# Patient Record
Sex: Male | Born: 1945 | Hispanic: Yes | Smoking: Former smoker
Health system: Southern US, Community
[De-identification: ages and names within clinical notes are randomized; demographics above are authoritative.]

## PROBLEM LIST (undated history)

## (undated) DIAGNOSIS — I10 Essential (primary) hypertension: Secondary | ICD-10-CM

---

## 2014-05-05 ENCOUNTER — Emergency Department (HOSPITAL_BASED_OUTPATIENT_CLINIC_OR_DEPARTMENT_OTHER)
Admission: EM | Admit: 2014-05-05 | Discharge: 2014-05-06 | Disposition: A | Discharge status: Home / Self Care | Payer: Medicaid Other | Attending: Emergency Medicine | Admitting: Emergency Medicine

## 2014-05-05 ENCOUNTER — Emergency Department (HOSPITAL_BASED_OUTPATIENT_CLINIC_OR_DEPARTMENT_OTHER): Payer: Medicaid Other

## 2014-05-05 ENCOUNTER — Encounter (HOSPITAL_BASED_OUTPATIENT_CLINIC_OR_DEPARTMENT_OTHER): Payer: Self-pay

## 2014-05-05 DIAGNOSIS — R0789 Other chest pain: Secondary | ICD-10-CM | POA: Diagnosis not present

## 2014-05-05 DIAGNOSIS — R079 Chest pain, unspecified: Secondary | ICD-10-CM

## 2014-05-05 DIAGNOSIS — M791 Myalgia: Secondary | ICD-10-CM | POA: Insufficient documentation

## 2014-05-05 DIAGNOSIS — Z87891 Personal history of nicotine dependence: Secondary | ICD-10-CM | POA: Diagnosis not present

## 2014-05-05 DIAGNOSIS — R509 Fever, unspecified: Secondary | ICD-10-CM | POA: Diagnosis not present

## 2014-05-05 DIAGNOSIS — R1031 Right lower quadrant pain: Secondary | ICD-10-CM

## 2014-05-05 DIAGNOSIS — R0602 Shortness of breath: Secondary | ICD-10-CM | POA: Diagnosis not present

## 2014-05-05 LAB — COMPREHENSIVE METABOLIC PANEL
ALBUMIN: 4.3 g/dL (ref 3.5–5.2)
ALT: 29 U/L (ref 0–53)
ANION GAP: 10 (ref 5–15)
AST: 22 U/L (ref 0–37)
Alkaline Phosphatase: 150 U/L — ABNORMAL HIGH (ref 39–117)
BUN: 14 mg/dL (ref 6–23)
CO2: 28 mmol/L (ref 19–32)
CREATININE: 1.03 mg/dL (ref 0.50–1.35)
Calcium: 9.3 mg/dL (ref 8.4–10.5)
Chloride: 98 mEq/L (ref 96–112)
GFR calc Af Amer: 84 mL/min — ABNORMAL LOW (ref >90)
GFR calc non Af Amer: 73 mL/min — ABNORMAL LOW (ref >90)
Glucose, Bld: 172 mg/dL — ABNORMAL HIGH (ref 70–99)
Potassium: 4 mmol/L (ref 3.5–5.1)
Sodium: 136 mmol/L (ref 135–145)
Total Bilirubin: 0.2 mg/dL — ABNORMAL LOW (ref 0.3–1.2)
Total Protein: 7.6 g/dL (ref 6.0–8.3)

## 2014-05-05 LAB — TROPONIN I

## 2014-05-05 LAB — CBC
HCT: 43.5 % (ref 39.0–52.0)
Hemoglobin: 14.8 g/dL (ref 13.0–17.0)
MCH: 28.5 pg (ref 26.0–34.0)
MCHC: 34 g/dL (ref 30.0–36.0)
MCV: 83.8 fL (ref 78.0–100.0)
Platelets: 215 10*3/uL (ref 150–400)
RBC: 5.19 MIL/uL (ref 4.22–5.81)
RDW: 13.4 % (ref 11.5–15.5)
WBC: 11.3 10*3/uL — ABNORMAL HIGH (ref 4.0–10.5)

## 2014-05-05 LAB — URINE MICROSCOPIC-ADD ON

## 2014-05-05 LAB — URINALYSIS, ROUTINE W REFLEX MICROSCOPIC
Bilirubin Urine: NEGATIVE
Glucose, UA: NEGATIVE mg/dL
Ketones, ur: NEGATIVE mg/dL
LEUKOCYTES UA: NEGATIVE
Nitrite: NEGATIVE
Protein, ur: NEGATIVE mg/dL
SPECIFIC GRAVITY, URINE: 1.018 (ref 1.005–1.030)
UROBILINOGEN UA: 0.2 mg/dL (ref 0.0–1.0)
pH: 6 (ref 5.0–8.0)

## 2014-05-05 LAB — D-DIMER, QUANTITATIVE: D-Dimer, Quant: 0.36 ug/mL-FEU (ref 0.00–0.48)

## 2014-05-05 MED ORDER — IOHEXOL 300 MG/ML  SOLN
100.0000 mL | Freq: Once | INTRAMUSCULAR | Status: AC | PRN
  Administered 2014-05-05: 100 mL via INTRAVENOUS

## 2014-05-05 MED ORDER — IOHEXOL 300 MG/ML  SOLN
25.0000 mL | Freq: Once | INTRAMUSCULAR | Status: AC | PRN
  Administered 2014-05-05: 25 mL via ORAL

## 2014-05-05 MED ORDER — ACETAMINOPHEN 325 MG PO TABS
ORAL_TABLET | ORAL | Status: AC
  Filled 2014-05-05: qty 2

## 2014-05-05 MED ORDER — ACETAMINOPHEN 325 MG PO TABS
650.0000 mg | ORAL_TABLET | Freq: Once | ORAL | Status: AC
  Administered 2014-05-05: 650 mg via ORAL

## 2014-05-05 NOTE — ED Notes (Signed)
C/o PC sine since 7pm-daughter interpreting

## 2014-05-05 NOTE — ED Provider Notes (Signed)
CSN: [id number]     Arrival date & time 05/05/14  1925 History  This chart was scribed for Brent Shiobert L Lynasia Meloche, MD by Brent Fisher, ED Scribe. This patient was seen in room MH03/MH03 and the patient's care was started at 7:58 PM.    Chief Complaint  Patient presents with  . Chest Pain     Patient is a 68 y.o. male presenting with chest pain. The history is provided by a relative. No language interpreter was used.  Chest Pain Associated symptoms: fever and shortness of breath   Associated symptoms: no cough     HPI Comments: Brent Fisher is a 68 y.o. male who presents to the Emergency Department complaining of chest pain. Pt's daughter is here to translate. She notes the pain radiates to his arms bilaterally. She notes associated SOB. She states they were bowling and he was fine before onset. She states she was unaware that he was febrile PTA. She notes his blood pressure has been elevated in the last week and he has not felt well because of this. He notes he has felt cold in the last couple of days.  he denies a cough.  He denies being exposed to anyone recently with a febrile illness.  He did travel to [place] approximately 2 weeks ago.  History reviewed. No pertinent past medical history. History reviewed. No pertinent past surgical history. No family history on file. History  Substance Use Topics  . Smoking status: Former Games developermoker  . Smokeless tobacco: Not on file  . Alcohol Use: Yes    Review of Systems  Constitutional: Positive for fever.  Respiratory: Positive for shortness of breath. Negative for cough.   Cardiovascular: Positive for chest pain.  Musculoskeletal: Positive for myalgias.  All other systems reviewed and are negative.     Allergies  Review of patient's allergies indicates no known allergies.  Home Medications   Prior to Admission medications   Not on File   BP 138/64 mmHg  Pulse 94  Temp(Src) 99.7 F (37.6 C) (Oral)  Resp 24  SpO2  95% Physical Exam  Constitutional: He is oriented to person, place, and time. He appears well-developed and well-nourished. No distress.  HENT:  Head: Normocephalic and atraumatic.  Eyes: Pupils are equal, round, and reactive to light.  Neck: Normal range of motion.  Cardiovascular: Normal rate and intact distal pulses.   Pulmonary/Chest: No respiratory distress.  Abdominal: Normal appearance. He exhibits no distension. There is tenderness in the right lower quadrant. There is tenderness at McBurney's point. There is no rigidity, no rebound and no guarding.    Musculoskeletal: Normal range of motion.  Neurological: He is alert and oriented to person, place, and time. No cranial nerve deficit.  Skin: Skin is warm and dry. No rash noted.  Psychiatric: He has a normal mood and affect. His behavior is normal.  Nursing note and vitals reviewed.   ED Course  Procedures (including critical care time) DIAGNOSTIC STUDIES: Oxygen Saturation is 94% on room air, adequate by my interpretation.    COORDINATION OF CARE: 8:02 PM Discussed treatment plan with patient at beside, the patient agrees with the plan and has no further questions at this time.   Labs Review Labs Reviewed  CBC - Abnormal; Notable for the following:    WBC 11.3 (*)    All other components within normal limits  COMPREHENSIVE METABOLIC PANEL - Abnormal; Notable for the following:    Glucose, Bld 172 (*)  Alkaline Phosphatase 150 (*)    Total Bilirubin 0.2 (*)    GFR calc non Af Amer 73 (*)    GFR calc Af Amer 84 (*)    All other components within normal limits  URINALYSIS, ROUTINE W REFLEX MICROSCOPIC - Abnormal; Notable for the following:    Hgb urine dipstick SMALL (*)    All other components within normal limits  TROPONIN I  D-DIMER, QUANTITATIVE  TROPONIN I  URINE MICROSCOPIC-ADD ON    Imaging Review Results for orders placed or performed during the hospital encounter of 05/05/14  CBC  Result Value Ref  Range   WBC 11.3 (H) 4.0 - 10.5 K/uL   RBC 5.19 4.22 - 5.81 MIL/uL   Hemoglobin 14.8 13.0 - 17.0 g/dL   HCT 16.1 09.6 - 04.5 %   MCV 83.8 78.0 - 100.0 fL   MCH 28.5 26.0 - 34.0 pg   MCHC 34.0 30.0 - 36.0 g/dL   RDW 40.9 81.1 - 91.4 %   Platelets 215 150 - 400 K/uL  Comprehensive metabolic panel  Result Value Ref Range   Sodium 136 135 - 145 mmol/L   Potassium 4.0 3.5 - 5.1 mmol/L   Chloride 98 96 - 112 mEq/L   CO2 28 19 - 32 mmol/L   Glucose, Bld 172 (H) 70 - 99 mg/dL   BUN 14 6 - 23 mg/dL   Creatinine, Ser 7.82 0.50 - 1.35 mg/dL   Calcium 9.3 8.4 - 95.6 mg/dL   Total Protein 7.6 6.0 - 8.3 g/dL   Albumin 4.3 3.5 - 5.2 g/dL   AST 22 0 - 37 U/L   ALT 29 0 - 53 U/L   Alkaline Phosphatase 150 (H) 39 - 117 U/L   Total Bilirubin 0.2 (L) 0.3 - 1.2 mg/dL   GFR calc non Af Amer 73 (L) >90 mL/min   GFR calc Af Amer 84 (L) >90 mL/min   Anion gap 10 5 - 15  Troponin I  Result Value Ref Range   Troponin I <0.03 <0.031 ng/mL  Urinalysis, Routine w reflex microscopic  Result Value Ref Range   Color, Urine YELLOW YELLOW   APPearance CLEAR CLEAR   Specific Gravity, Urine 1.018 1.005 - 1.030   pH 6.0 5.0 - 8.0   Glucose, UA NEGATIVE NEGATIVE mg/dL   Hgb urine dipstick SMALL (A) NEGATIVE   Bilirubin Urine NEGATIVE NEGATIVE   Ketones, ur NEGATIVE NEGATIVE mg/dL   Protein, ur NEGATIVE NEGATIVE mg/dL   Urobilinogen, UA 0.2 0.0 - 1.0 mg/dL   Nitrite NEGATIVE NEGATIVE   Leukocytes, UA NEGATIVE NEGATIVE  D-dimer, quantitative  Result Value Ref Range   D-Dimer, Quant 0.36 0.00 - 0.48 ug/mL-FEU  Troponin I  Result Value Ref Range   Troponin I <0.03 <0.031 ng/mL  Urine microscopic-add on  Result Value Ref Range   Squamous Epithelial / LPF RARE RARE   WBC, UA 0-2 <3 WBC/hpf   RBC / HPF 0-2 <3 RBC/hpf   Dg Chest 2 View  05/05/2014   CLINICAL DATA:  Sudden onset chest pain, difficulty breathing, fever  EXAM: CHEST  2 VIEW  COMPARISON:  None.  FINDINGS: Lungs are essentially clear.  No  pleural effusion or pneumothorax.  Heart is normal in size.  Mild degenerative changes of the visualized thoracolumbar spine.  IMPRESSION: No evidence of acute cardiopulmonary disease.   Electronically Signed   By: Charline Bills M.D.   On: 05/05/2014 20:22   Ct Abdomen Pelvis W Contrast  05/05/2014  CLINICAL DATA:  Chest pain beginning last night, radiating to bilateral arms. RIGHT lower quadrant tenderness.  EXAM: CT ABDOMEN AND PELVIS WITH CONTRAST  TECHNIQUE: Multidetector CT imaging of the abdomen and pelvis was performed using the standard protocol following bolus administration of intravenous contrast.  CONTRAST:  100 cc Omnipaque 300.  COMPARISON:  None.  FINDINGS: LUNG BASES: Included view of the lung bases demonstrate dependent atelectasis. Heart size is upper limits of normal, pericardium is unremarkable.  SOLID ORGANS: The spleen, gallbladder, pancreas and adrenal glands are unremarkable. The liver is diffusely hypodense consistent with hepatic steatosis and, otherwise unremarkable.  GASTROINTESTINAL TRACT: The stomach, small and large bowel are normal in course and caliber without inflammatory changes. Enteric contrast has not yet reached the distal small bowel. The appendix is not discretely identified, however there are no inflammatory changes in the right lower quadrant.  KIDNEYS/ URINARY TRACT: Kidneys are orthotopic, demonstrating symmetric enhancement. No nephrolithiasis, hydronephrosis or solid renal masses. Too small to characterize hypodensities appearing kidneys bilaterally, superimposed cysts measure up to 16 mm RIGHT upper pole. 2 mm nonobstructing LEFT lower pole nephrolithiasis. The unopacified ureters are normal in course and caliber. Delayed imaging through the kidneys demonstrates symmetric prompt contrast excretion within the proximal urinary collecting system. Urinary bladder is partially distended and unremarkable.  PERITONEUM/RETROPERITONEUM: No intraperitoneal free fluid  nor free air. Aortoiliac vessels are normal in course and caliber. No lymphadenopathy by CT size criteria. Prostate is enlarged, 5.5 x 4.5 cm.  SOFT TISSUE/OSSEOUS STRUCTURES: Nonsuspicious. Severe L5-S1 degenerative disc resulting in severe neural foraminal narrowing at this level.  IMPRESSION: Nonvisualized appendix without secondary signs of acute appendicitis. No acute intra-abdominal or pelvic process.  Nonobstructing 2 mm LEFT lower pole nephrolithiasis.  Hepatic steatosis.   Electronically Signed   By: Awilda Metroourtnay  Bloomer   On: 05/05/2014 22:58       EKG Interpretation   Date/Time:  Monday May 05 2014 19:37:24 EST Ventricular Rate:  107 PR Interval:  152 QRS Duration: 90 QT Interval:  326 QTC Calculation: 435 R Axis:   -34 Text Interpretation:  Sinus tachycardia Left axis deviation Abnormal ECG  No previous tracing Confirmed by Rayce Brahmbhatt  MD, Soley Harriss ([id number]) on 05/05/2014  7:57:00 PM      MDM   Final diagnoses:  RLQ abdominal pain  Atypical chest pain  Acute febrile illness  I personally performed the services described in this documentation, which was scribed in my presence. The recorded information has been reviewed and considered.    Brent Shiobert L Mylah Baynes, MD 05/15/14 [phone]

## 2014-05-05 NOTE — ED Provider Notes (Signed)
Nursing notes and vitals signs, including pulse oximetry, reviewed.  Summary of this visit's results, reviewed by myself:  Labs:  Results for orders placed or performed during the hospital encounter of 05/05/14 (from the past 24 hour(s))  CBC     Status: Abnormal   Collection Time: 05/05/14  7:35 PM  Result Value Ref Range   WBC 11.3 (H) 4.0 - 10.5 K/uL   RBC 5.19 4.22 - 5.81 MIL/uL   Hemoglobin 14.8 13.0 - 17.0 g/dL   HCT 09.843.5 11.939.0 - 14.752.0 %   MCV 83.8 78.0 - 100.0 fL   MCH 28.5 26.0 - 34.0 pg   MCHC 34.0 30.0 - 36.0 g/dL   RDW 82.913.4 56.211.5 - 13.015.5 %   Platelets 215 150 - 400 K/uL  Comprehensive metabolic panel     Status: Abnormal   Collection Time: 05/05/14  7:35 PM  Result Value Ref Range   Sodium 136 135 - 145 mmol/L   Potassium 4.0 3.5 - 5.1 mmol/L   Chloride 98 96 - 112 mEq/L   CO2 28 19 - 32 mmol/L   Glucose, Bld 172 (H) 70 - 99 mg/dL   BUN 14 6 - 23 mg/dL   Creatinine, Ser 8.651.03 0.50 - 1.35 mg/dL   Calcium 9.3 8.4 - 78.410.5 mg/dL   Total Protein 7.6 6.0 - 8.3 g/dL   Albumin 4.3 3.5 - 5.2 g/dL   AST 22 0 - 37 U/L   ALT 29 0 - 53 U/L   Alkaline Phosphatase 150 (H) 39 - 117 U/L   Total Bilirubin 0.2 (L) 0.3 - 1.2 mg/dL   GFR calc non Af Amer 73 (L) >90 mL/min   GFR calc Af Amer 84 (L) >90 mL/min   Anion gap 10 5 - 15  Troponin I     Status: None   Collection Time: 05/05/14  7:35 PM  Result Value Ref Range   Troponin I <0.03 <0.031 ng/mL  Urinalysis, Routine w reflex microscopic     Status: Abnormal   Collection Time: 05/05/14 11:00 PM  Result Value Ref Range   Color, Urine YELLOW YELLOW   APPearance CLEAR CLEAR   Specific Gravity, Urine 1.018 1.005 - 1.030   pH 6.0 5.0 - 8.0   Glucose, UA NEGATIVE NEGATIVE mg/dL   Hgb urine dipstick SMALL (A) NEGATIVE   Bilirubin Urine NEGATIVE NEGATIVE   Ketones, ur NEGATIVE NEGATIVE mg/dL   Protein, ur NEGATIVE NEGATIVE mg/dL   Urobilinogen, UA 0.2 0.0 - 1.0 mg/dL   Nitrite NEGATIVE NEGATIVE   Leukocytes, UA NEGATIVE NEGATIVE   Urine microscopic-add on     Status: None   Collection Time: 05/05/14 11:00 PM  Result Value Ref Range   Squamous Epithelial / LPF RARE RARE   WBC, UA 0-2 <3 WBC/hpf   RBC / HPF 0-2 <3 RBC/hpf  D-dimer, quantitative     Status: None   Collection Time: 05/05/14 11:02 PM  Result Value Ref Range   D-Dimer, Quant 0.36 0.00 - 0.48 ug/mL-FEU  Troponin I     Status: None   Collection Time: 05/05/14 11:16 PM  Result Value Ref Range   Troponin I <0.03 <0.031 ng/mL    Imaging Studies: Dg Chest 2 View  05/05/2014   CLINICAL DATA:  Sudden onset chest pain, difficulty breathing, fever  EXAM: CHEST  2 VIEW  COMPARISON:  None.  FINDINGS: Lungs are essentially clear.  No pleural effusion or pneumothorax.  Heart is normal in size.  Mild degenerative changes of the  visualized thoracolumbar spine.  IMPRESSION: No evidence of acute cardiopulmonary disease.   Electronically Signed   By: Charline BillsSriyesh  Krishnan M.D.   On: 05/05/2014 20:22   Ct Abdomen Pelvis W Contrast  05/05/2014   CLINICAL DATA:  Chest pain beginning last night, radiating to bilateral arms. RIGHT lower quadrant tenderness.  EXAM: CT ABDOMEN AND PELVIS WITH CONTRAST  TECHNIQUE: Multidetector CT imaging of the abdomen and pelvis was performed using the standard protocol following bolus administration of intravenous contrast.  CONTRAST:  100 cc Omnipaque 300.  COMPARISON:  None.  FINDINGS: LUNG BASES: Included view of the lung bases demonstrate dependent atelectasis. Heart size is upper limits of normal, pericardium is unremarkable.  SOLID ORGANS: The spleen, gallbladder, pancreas and adrenal glands are unremarkable. The liver is diffusely hypodense consistent with hepatic steatosis and, otherwise unremarkable.  GASTROINTESTINAL TRACT: The stomach, small and large bowel are normal in course and caliber without inflammatory changes. Enteric contrast has not yet reached the distal small bowel. The appendix is not discretely identified, however there are  no inflammatory changes in the right lower quadrant.  KIDNEYS/ URINARY TRACT: Kidneys are orthotopic, demonstrating symmetric enhancement. No nephrolithiasis, hydronephrosis or solid renal masses. Too small to characterize hypodensities appearing kidneys bilaterally, superimposed cysts measure up to 16 mm RIGHT upper pole. 2 mm nonobstructing LEFT lower pole nephrolithiasis. The unopacified ureters are normal in course and caliber. Delayed imaging through the kidneys demonstrates symmetric prompt contrast excretion within the proximal urinary collecting system. Urinary bladder is partially distended and unremarkable.  PERITONEUM/RETROPERITONEUM: No intraperitoneal free fluid nor free air. Aortoiliac vessels are normal in course and caliber. No lymphadenopathy by CT size criteria. Prostate is enlarged, 5.5 x 4.5 cm.  SOFT TISSUE/OSSEOUS STRUCTURES: Nonsuspicious. Severe L5-S1 degenerative disc resulting in severe neural foraminal narrowing at this level.  IMPRESSION: Nonvisualized appendix without secondary signs of acute appendicitis. No acute intra-abdominal or pelvic process.  Nonobstructing 2 mm LEFT lower pole nephrolithiasis.  Hepatic steatosis.   Electronically Signed   By: Awilda Metroourtnay  Bloomer   On: 05/05/2014 22:58    11:52 PM Patient feeling better. Advised of laboratory and radiographic findings. He was advised to return should symptoms worsen or change.  Hanley SeamenJohn L Satrina Magallanes, MD 05/05/14 [phone]

## 2015-12-12 IMAGING — CR DG CHEST 2V
2 series · 2 of 2 positions shown · non-contrast
Comparison: None.

CLINICAL DATA: Sudden onset chest pain, difficulty breathing, fever

EXAM:
CHEST  2 VIEW

[w chest pa]
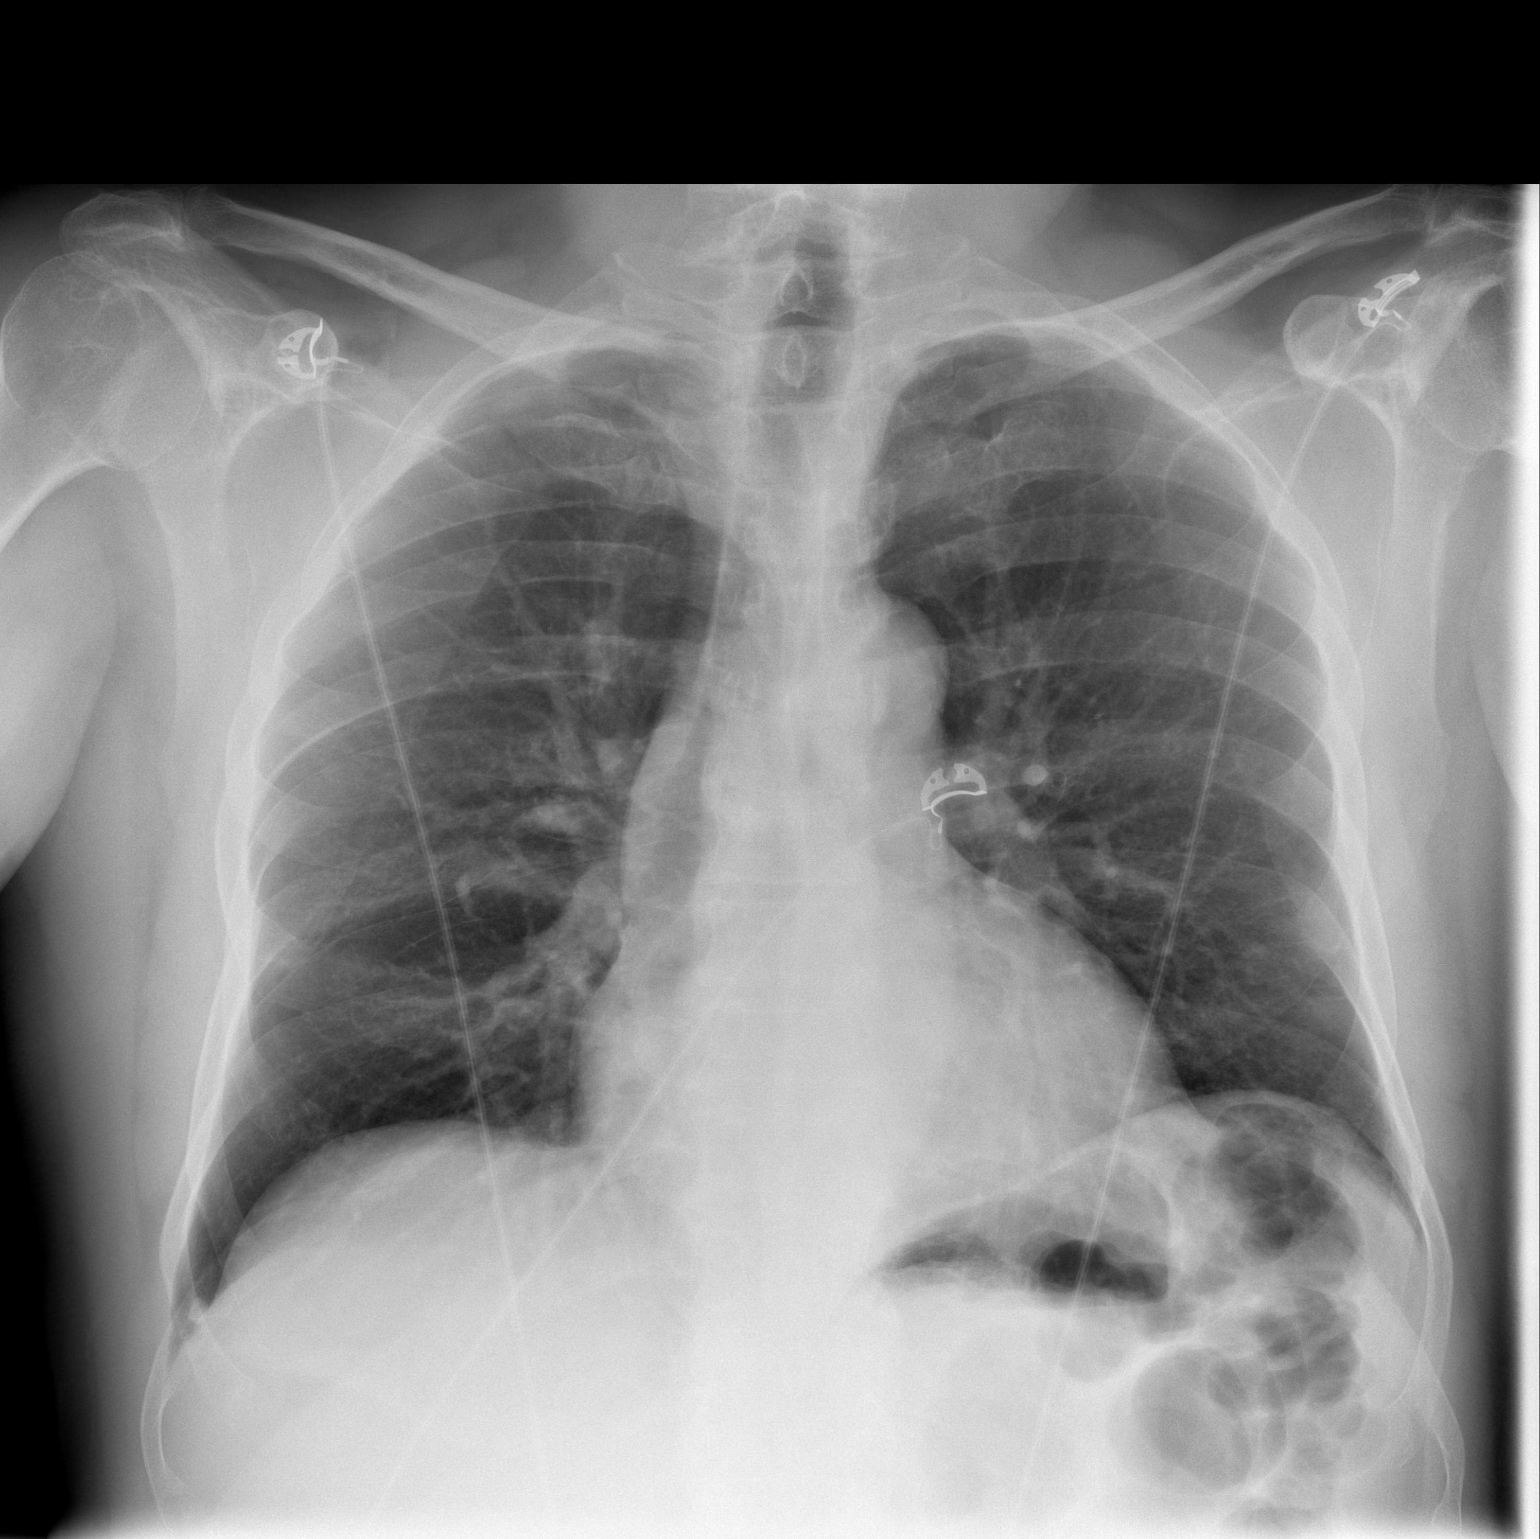

[w chest lat]
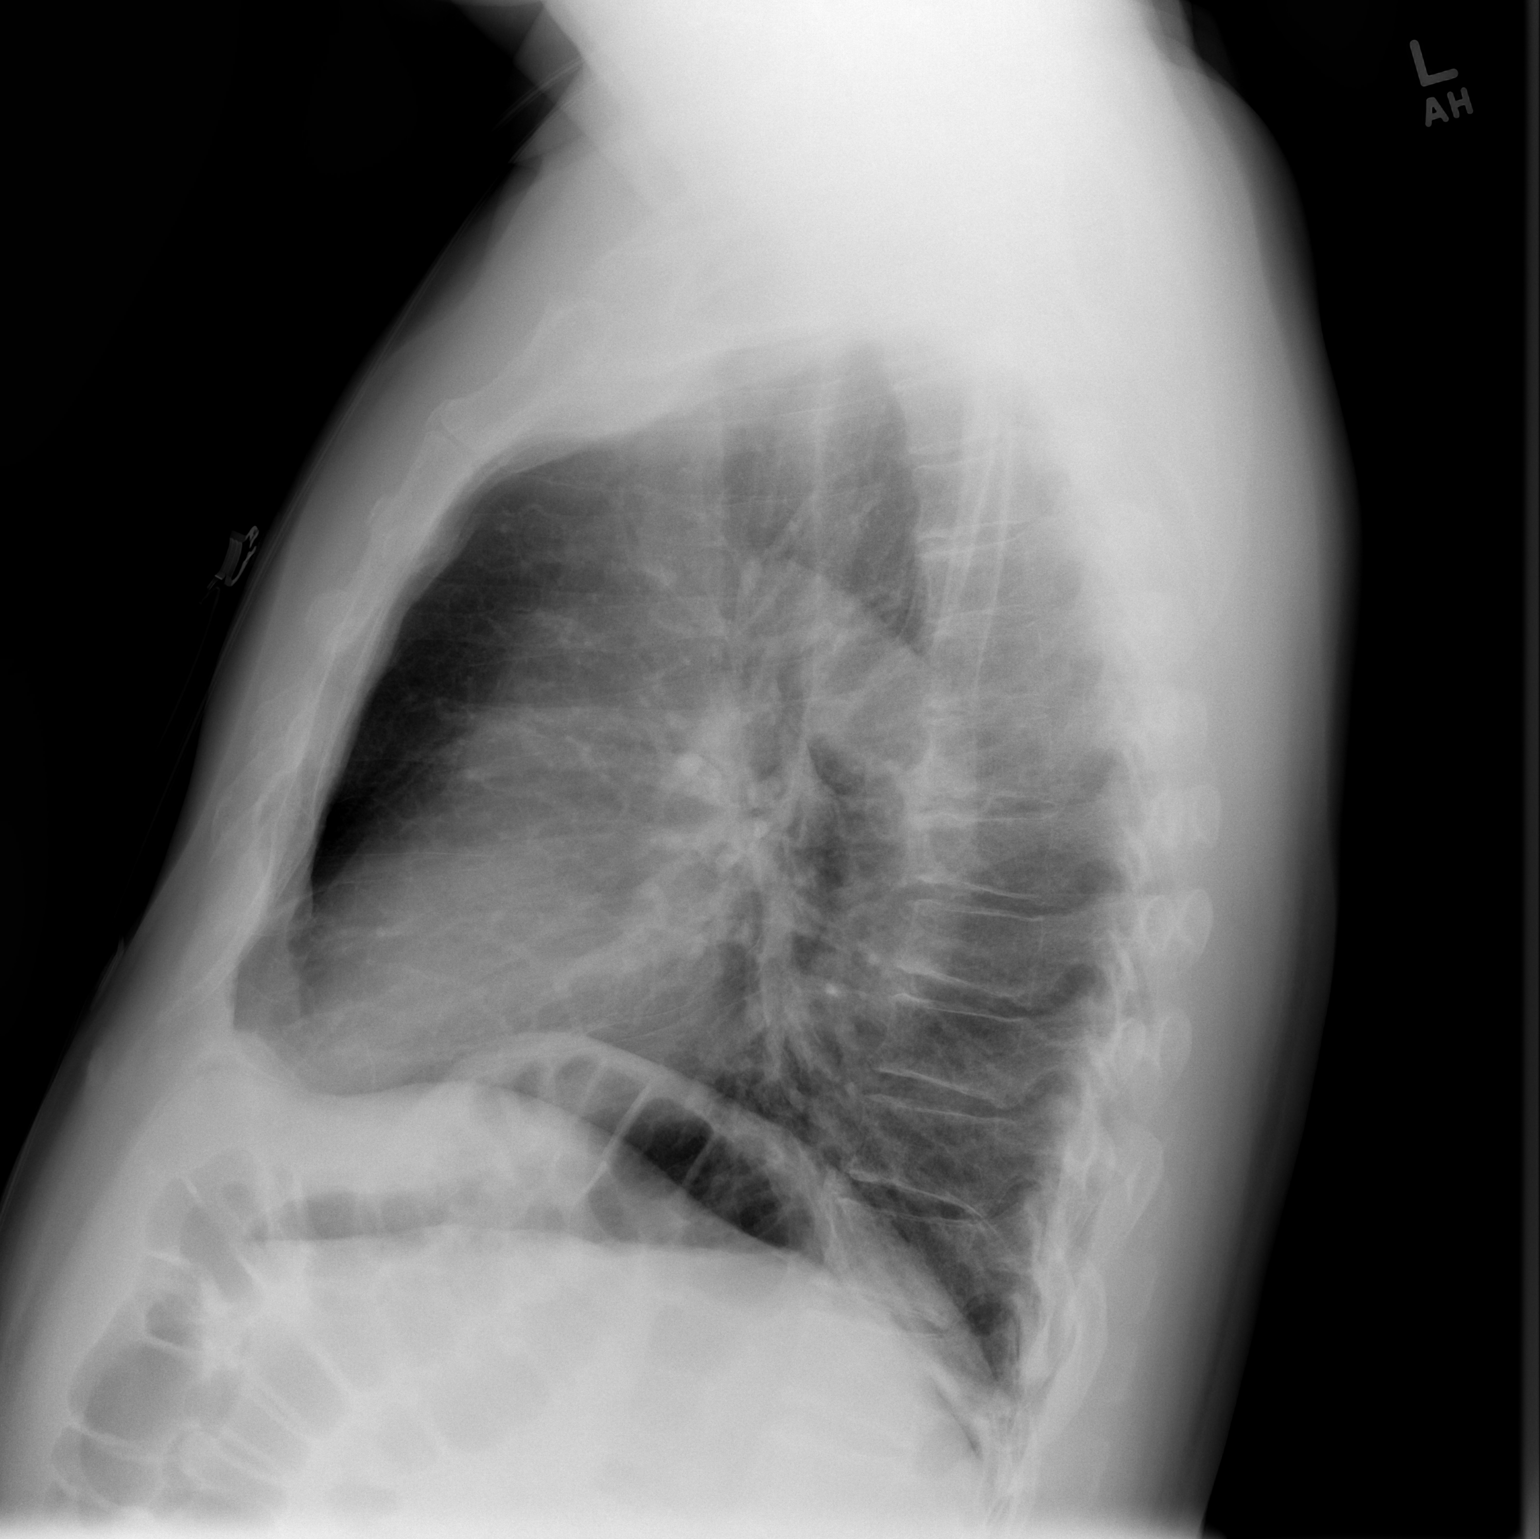

[2 of 2 positions shown; findings below may reference images not displayed]

FINDINGS: Lungs are essentially clear.  No pleural effusion or pneumothorax.

Heart is normal in size.

Mild degenerative changes of the visualized thoracolumbar spine.
IMPRESSION: No evidence of acute cardiopulmonary disease.

## 2021-09-19 ENCOUNTER — Other Ambulatory Visit: Payer: Self-pay

## 2021-09-19 ENCOUNTER — Emergency Department (HOSPITAL_COMMUNITY): Payer: Medicare Other

## 2021-09-19 ENCOUNTER — Emergency Department (HOSPITAL_BASED_OUTPATIENT_CLINIC_OR_DEPARTMENT_OTHER): Payer: Medicare Other

## 2021-09-19 ENCOUNTER — Emergency Department (HOSPITAL_BASED_OUTPATIENT_CLINIC_OR_DEPARTMENT_OTHER)
Admission: EM | Admit: 2021-09-19 | Discharge: 2021-09-19 | Disposition: A | Discharge status: Home / Self Care | Payer: Medicare Other | Attending: Emergency Medicine | Admitting: Emergency Medicine

## 2021-09-19 ENCOUNTER — Encounter (HOSPITAL_BASED_OUTPATIENT_CLINIC_OR_DEPARTMENT_OTHER): Payer: Self-pay | Admitting: Emergency Medicine

## 2021-09-19 DIAGNOSIS — R079 Chest pain, unspecified: Secondary | ICD-10-CM | POA: Diagnosis present

## 2021-09-19 DIAGNOSIS — Z20822 Contact with and (suspected) exposure to covid-19: Secondary | ICD-10-CM | POA: Diagnosis not present

## 2021-09-19 DIAGNOSIS — R519 Headache, unspecified: Secondary | ICD-10-CM | POA: Diagnosis present

## 2021-09-19 DIAGNOSIS — R200 Anesthesia of skin: Secondary | ICD-10-CM | POA: Diagnosis present

## 2021-09-19 DIAGNOSIS — G510 Bell's palsy: Secondary | ICD-10-CM | POA: Insufficient documentation

## 2021-09-19 HISTORY — DX: Essential (primary) hypertension: I10

## 2021-09-19 LAB — URINALYSIS, ROUTINE W REFLEX MICROSCOPIC
Bilirubin Urine: NEGATIVE
Glucose, UA: NEGATIVE mg/dL
Hgb urine dipstick: NEGATIVE
Ketones, ur: NEGATIVE mg/dL
Leukocytes,Ua: NEGATIVE
Nitrite: NEGATIVE
Protein, ur: NEGATIVE mg/dL
Specific Gravity, Urine: 1.015 (ref 1.005–1.030)
pH: 7 (ref 5.0–8.0)

## 2021-09-19 LAB — DIFFERENTIAL
Abs Immature Granulocytes: 0.04 K/uL (ref 0.00–0.07)
Basophils Absolute: 0 K/uL (ref 0.0–0.1)
Basophils Relative: 0 %
Eosinophils Absolute: 0 K/uL (ref 0.0–0.5)
Eosinophils Relative: 1 %
Immature Granulocytes: 1 %
Lymphocytes Relative: 37 %
Lymphs Abs: 1.7 K/uL (ref 0.7–4.0)
Monocytes Absolute: 0.9 K/uL (ref 0.1–1.0)
Monocytes Relative: 19 %
Neutro Abs: 2 K/uL (ref 1.7–7.7)
Neutrophils Relative %: 42 %

## 2021-09-19 LAB — APTT: aPTT: 28 s (ref 24–36)

## 2021-09-19 LAB — PROTIME-INR
INR: 1.1 (ref 0.8–1.2)
Prothrombin Time: 14 seconds (ref 11.4–15.2)

## 2021-09-19 LAB — TROPONIN I (HIGH SENSITIVITY)
Troponin I (High Sensitivity): 3 ng/L
Troponin I (High Sensitivity): 3 ng/L (ref <18)

## 2021-09-19 LAB — RESP PANEL BY RT-PCR (FLU A&B, COVID) ARPGX2
Influenza A by PCR: NEGATIVE
Influenza B by PCR: NEGATIVE
SARS Coronavirus 2 by RT PCR: NEGATIVE

## 2021-09-19 LAB — CBC
HCT: 39.6 % (ref 39.0–52.0)
Hemoglobin: 13.1 g/dL (ref 13.0–17.0)
MCH: 27.2 pg (ref 26.0–34.0)
MCHC: 33.1 g/dL (ref 30.0–36.0)
MCV: 82.2 fL (ref 80.0–100.0)
Platelets: 138 K/uL — ABNORMAL LOW (ref 150–400)
RBC: 4.82 MIL/uL (ref 4.22–5.81)
RDW: 13.2 % (ref 11.5–15.5)
WBC: 4.7 K/uL (ref 4.0–10.5)
nRBC: 0 % (ref 0.0–0.2)

## 2021-09-19 LAB — COMPREHENSIVE METABOLIC PANEL
ALT: 22 U/L (ref 0–44)
AST: 20 U/L (ref 15–41)
Albumin: 4 g/dL (ref 3.5–5.0)
Alkaline Phosphatase: 106 U/L (ref 38–126)
Anion gap: 4 — ABNORMAL LOW (ref 5–15)
BUN: 19 mg/dL (ref 8–23)
CO2: 28 mmol/L (ref 22–32)
Calcium: 8.6 mg/dL — ABNORMAL LOW (ref 8.9–10.3)
Chloride: 105 mmol/L (ref 98–111)
Creatinine, Ser: 1.06 mg/dL (ref 0.61–1.24)
GFR, Estimated: >60 mL/min (ref >60)
Glucose, Bld: 82 mg/dL (ref 70–99)
Potassium: 4.4 mmol/L (ref 3.5–5.1)
Sodium: 137 mmol/L (ref 135–145)
Total Bilirubin: 0.6 mg/dL (ref 0.3–1.2)
Total Protein: 7.2 g/dL (ref 6.5–8.1)

## 2021-09-19 LAB — ETHANOL: Alcohol, Ethyl (B): <10 mg/dL

## 2021-09-19 LAB — RAPID URINE DRUG SCREEN, HOSP PERFORMED
Amphetamines: NOT DETECTED
Barbiturates: NOT DETECTED
Benzodiazepines: NOT DETECTED
Cocaine: NOT DETECTED
Opiates: NOT DETECTED
Tetrahydrocannabinol: NOT DETECTED

## 2021-09-19 LAB — CBG MONITORING, ED: Glucose-Capillary: 99 mg/dL (ref 70–99)

## 2021-09-19 MED ORDER — VALACYCLOVIR HCL 1 G PO TABS: 1000.0000 mg | ORAL_TABLET | Freq: Three times a day (TID) | ORAL | 0 refills | Status: AC

## 2021-09-19 MED ORDER — PREDNISONE 20 MG PO TABS: ORAL_TABLET | ORAL | 0 refills | Status: AC

## 2021-09-19 MED ORDER — ACETAMINOPHEN 325 MG PO TABS
650.0000 mg | ORAL_TABLET | Freq: Once | ORAL | Status: AC
  Administered 2021-09-19: 650 mg via ORAL
  Filled 2021-09-19: qty 2

## 2021-09-19 NOTE — ED Notes (Signed)
To MRI

## 2021-09-19 NOTE — ED Provider Notes (Signed)
?  Physical Exam  ?BP 133/79   Pulse 98   Temp 99.3 ?F (37.4 ?C)   Resp 18   Wt 90.7 kg   SpO2 98%  ? ?Physical Exam ? ?Procedures  ?Procedures ? ?ED Course / MDM  ?  ?Medical Decision Making ?Patient was transferred here from med center for right facial droop. MRI brain ordered  ? ?7:20 PM ?I independently reviewed and interpreted MRI. MRI showed no acute stroke. On exam, has watery eye on the right and difficulty closing R eye. I think he has bell's palsy. Will dc home with steroids, valtrex  ? ?Problems Addressed: ?Bell's palsy: acute illness or injury ?Numbness: acute illness or injury ? ?Amount and/or Complexity of Data Reviewed ?Labs: ordered. Decision-making details documented in ED Course. ?Radiology: ordered and independent interpretation performed. Decision-making details documented in ED Course. ? ?Risk ?OTC drugs. ? ? ? ? ? ? ? ?  ?Charlynne Pander, MD ?09/19/21 1921 ? ?

## 2021-09-19 NOTE — ED Triage Notes (Signed)
Family reports right sided facial droop, headache, and chest pressure that they noticed this morning after waking up. Other family reports sx since Friday. Pt ambulatory. Denies dizziness. ? ?MD in triage to assess.  ? ?Numbness on right arm and right side of face.  ?

## 2021-09-19 NOTE — ED Notes (Signed)
Pt arrives BIB [facility] from [facility], c/o R sided HA and facial droop x1 week. NIHHS 2. No other neuro deficits. Ambulatory without assistance. Here for MRI/neuro.  ?

## 2021-09-19 NOTE — ED Provider Notes (Signed)
?[facility] EMERGENCY DEPARTMENT ?Provider Note ? ? ?CSN: [id number] ?Arrival date & time: 09/19/21  1314 ? ?  ? ?History ? ?Chief Complaint  ?Patient presents with  ? Numbness  ? ? ?Brent Fisher is a 76 y.o. male. ? ?Patient here with right-sided numbness, right-sided facial droop, headache.  Seems like this might have started Friday per daughter who is with the patient.  She states that her brother noticed on Friday night may be some right-sided facial droop.  Patient states that he has had numbness since may be last night but family member today thought there was some right-sided facial droop.  Continues to have right-sided numbness of the face and arm.  No speech changes or vision changes or weakness of extremities.  Had a mild headache but that only started today.  No dizziness.  He has been able to ambulate without any issues.  History of high blood pressure.  Has never had a stroke.  Denies any anxiety, nausea, vomiting, shortness of breath.  Has little chest pressure as well. ? ?The history is provided by the patient.  ? ?  ? ?Home Medications ?Prior to Admission medications   ?Not on File  ?   ? ?Allergies    ?Patient has no known allergies.   ? ?Review of Systems   ?Review of Systems ? ?Physical Exam ?Updated Vital Signs ?BP 124/77   Pulse (!) 58   Temp 98.1 ?F (36.7 ?C) (Oral)   Resp 19   SpO2 98%  ?Physical Exam ?Vitals and nursing note reviewed.  ?Constitutional:   ?   General: He is not in acute distress. ?   Appearance: He is well-developed. He is not ill-appearing.  ?HENT:  ?   Head: Normocephalic and atraumatic.  ?   Nose: Nose normal.  ?   Mouth/Throat:  ?   Mouth: Mucous membranes are moist.  ?Eyes:  ?   Extraocular Movements: Extraocular movements intact.  ?   Conjunctiva/sclera: Conjunctivae normal.  ?   Pupils: Pupils are equal, round, and reactive to light.  ?Cardiovascular:  ?   Rate and Rhythm: Normal rate and regular rhythm.  ?   Pulses: Normal pulses.  ?   Heart sounds:  Normal heart sounds. No murmur heard. ?Pulmonary:  ?   Effort: Pulmonary effort is normal. No respiratory distress.  ?   Breath sounds: Normal breath sounds.  ?Abdominal:  ?   Palpations: Abdomen is soft.  ?   Tenderness: There is no abdominal tenderness.  ?Musculoskeletal:     ?   General: No swelling.  ?   Cervical back: Normal range of motion and neck supple.  ?Skin: ?   General: Skin is warm and dry.  ?   Capillary Refill: Capillary refill takes less than 2 seconds.  ?Neurological:  ?   Mental Status: He is alert.  ?   Comments: Numbness to the right side of the face, right arm but otherwise normal sensation throughout, may be some trace right-sided facial droop involving the right lower face but otherwise strength is 5+ out of 5 throughout, normal speech, no visual field deficit, normal finger-nose-finger, normal heel-to-shin, normal gait  ?Psychiatric:     ?   Mood and Affect: Mood normal.  ? ? ?ED Results / Procedures / Treatments   ?Labs ?(all labs ordered are listed, but only abnormal results are displayed) ?Labs Reviewed  ?CBC - Abnormal; Notable for the following components:  ?    Result Value  ?  Platelets 138 (*)   ? All other components within normal limits  ?COMPREHENSIVE METABOLIC PANEL - Abnormal; Notable for the following components:  ? Calcium 8.6 (*)   ? Anion gap 4 (*)   ? All other components within normal limits  ?RESP PANEL BY RT-PCR (FLU A&B, COVID) ARPGX2  ?ETHANOL  ?PROTIME-INR  ?APTT  ?DIFFERENTIAL  ?RAPID URINE DRUG SCREEN, HOSP PERFORMED  ?URINALYSIS, ROUTINE W REFLEX MICROSCOPIC  ?CBG MONITORING, ED  ?TROPONIN I (HIGH SENSITIVITY)  ? ? ?EKG ?EKG Interpretation ? ?Date/Time:  Sunday Sep 19 2021 13:52:55 EDT ?Ventricular Rate:  61 ?PR Interval:  168 ?QRS Duration: 102 ?QT Interval:  424 ?QTC Calculation: 428 ?R Axis:   -66 ?Text Interpretation: Sinus rhythm LAD, consider left anterior fascicular block Confirmed by Lockie Molauratolo, Alexandro Line ([id number]) on 09/19/2021 2:42:31 PM ? ?Radiology ?CT HEAD WO  CONTRAST ? ?Result Date: 09/19/2021 ?CLINICAL DATA:  Neuro deficit, acute, stroke suspected right numbness and facial droop EXAM: CT HEAD WITHOUT CONTRAST TECHNIQUE: Contiguous axial images were obtained from the base of the skull through the vertex without intravenous contrast. RADIATION DOSE REDUCTION: This exam was performed according to the departmental dose-optimization program which includes automated exposure control, adjustment of the mA and/or kV according to patient size and/or use of iterative reconstruction technique. COMPARISON:  None Available. FINDINGS: Brain: No evidence of acute infarction, hemorrhage, hydrocephalus, extra-axial collection or mass lesion/mass effect. Vascular: Scattered vascular calcifications. Skull: Normal. Negative for fracture or focal lesion. Sinuses/Orbits: RIGHT mastoid effusion. Other: None. IMPRESSION: No acute intracranial abnormality. If persistent concern, recommend dedicated MRI. Electronically Signed   By: Meda KlinefelterStephanie  Peacock M.D.   On: 09/19/2021 13:46   ? ?Procedures ?Procedures  ? ? ?Medications Ordered in ED ?Medications  ?acetaminophen (TYLENOL) tablet 650 mg (650 mg Oral Given 09/19/21 1412)  ? ? ?ED Course/ Medical Decision Making/ A&P ?  ?                        ?Medical Decision Making ?Amount and/or Complexity of Data Reviewed ?Labs: ordered. ?Radiology: ordered. ? ?Risk ?OTC drugs. ? ? ?Brent Fisher is here with right-sided facial droop, right-sided numbness.  Normal vitals.  No fever.  Symptoms may be started last night may be the day before.  Daughter with patient today noticed right-sided facial droop, family member at home thought maybe this started on Friday as well.  Patient has numbness to the right side of the face and right arm but otherwise normal sensation throughout.  No aphasia or extremity weakness.  No neglect.  No visual field loss.  Patient does have maybe some mild right-sided facial droop involving the lower face.  Does not appear to be a  Bell's palsy.  Has a history of hypertension.  Vital signs are normal here.  He stained may be some chest pressure but not currently.  EKG per my review and interpretation shows sinus rhythm.  No ischemic changes.  He is got strong pulses throughout.  He does have a headache which is atypical for him.  Differential is stroke versus complex migraine versus less likely vertigo or ACS.  Will check labs including troponin, CBC, CMP.  We will get a head CT.  [facility]. ? ?Per my review and interpretation of labs there is no significant anemia, electrolyte abnormality, kidney injury, leukocytosis.  Troponin is normal.  CT scan of the head shows no head bleed.  COVID and flu test normal.  Patient to be admitted to [facility] for MRI.  Dr. Karene Fry aware. ? ?This chart was dictated using voice recognition software.  Despite best efforts to proofread,  errors can occur which can change the documentation meaning.  ? ? ? ? ? ? ? ?Final Clinical Impression(s) / ED Diagnoses ?Final diagnoses:  ?Numbness  ? ? ?Rx / DC Orders ?ED Discharge Orders   ? ? None  ? ?  ? ? ?  ?Virgina Norfolk, DO ?09/19/21 1449 ? ?

## 2021-09-19 NOTE — ED Notes (Signed)
Patient transported to CT 

## 2021-09-19 NOTE — Discharge Instructions (Signed)
Take prednisone and Valtrex as prescribed ? ?See your doctor for follow-up. ? ?Make sure you put artificial tears in the right eye so it does not dry out.  Please try an eye patch at night as well ? ?Return to ER if you have worse facial droop, trouble eating, eye pain ?

## 2021-09-19 NOTE — ED Notes (Signed)
All sxs started Thurs pm ?

## 2021-09-19 NOTE — ED Notes (Signed)
Upon RN assessment, RN noted R sided facial droop--pt reports L sided facial numbness, R arm numbness.  ? ?Daughter states "to me it seems like its his L side that drooping"--awaiting MRI  ?

## 2021-09-25 NOTE — Progress Notes (Signed)
ok 

## 2023-04-28 IMAGING — MR MR HEAD W/O CM
6 of 10 series · 29 of 48 positions shown · non-contrast
Comparison: Head CT 09/19/2021

CLINICAL DATA: Neuro deficit, acute, stroke suspected r/o stroke.
Left arm weakness and facial droop.

EXAM:
MRI HEAD WITHOUT CONTRAST
TECHNIQUE: Multiplanar, multiecho pulse sequences of the brain and surrounding
structures were obtained without intravenous contrast.

[Series 2: DWI · axial · 3.0mm · 0.94mm/px · z∈[-42,+105]mm · 9 of 100 slices shown (1 of 2)]
[im 1/100]
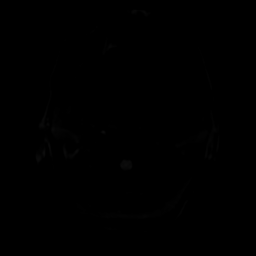
[im 13/100]
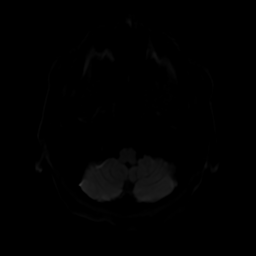
[im 25/100]
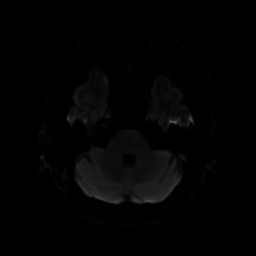
[im 38/100]
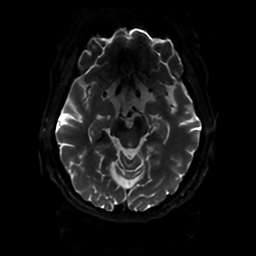
[im 50/100]
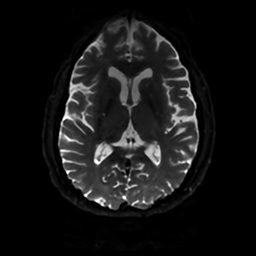
[im 62/100]
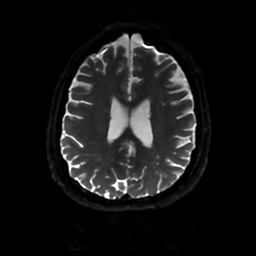
[im 75/100]
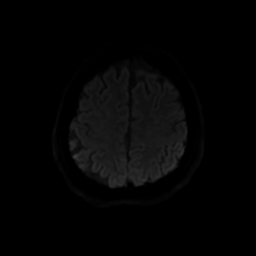
[im 87/100]
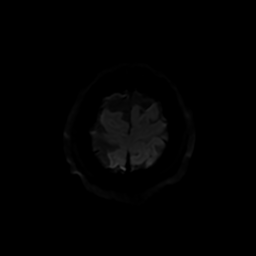
[im 100/100]
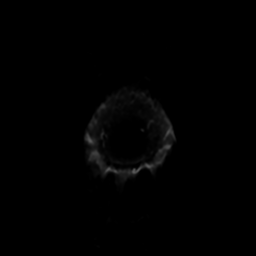

[Series 3: DWI · coronal · 4.0mm · 0.94mm/px · 7 of 73 slices shown (2 of 2)]
[im 1/73]
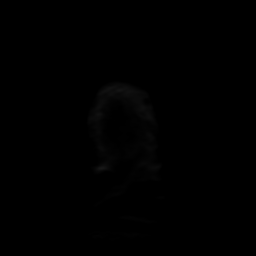
[im 13/73]
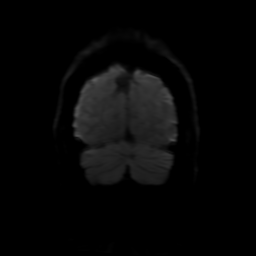
[im 25/73]
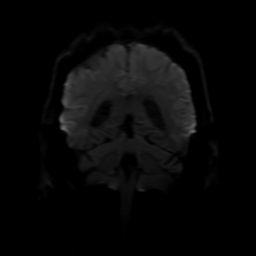
[im 37/73]
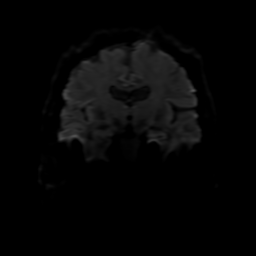
[im 49/73]
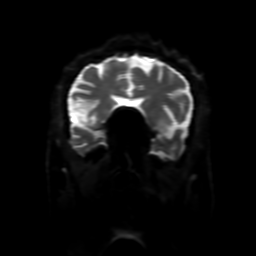
[im 61/73]
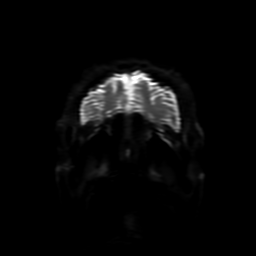
[im 73/73]
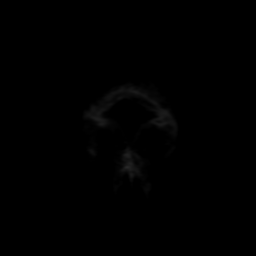

[Series 4: FLAIR · sagittal · 5.0mm · 0.23mm/px · 2 of 25 slices shown (1 of 2)]
[im 1/25]
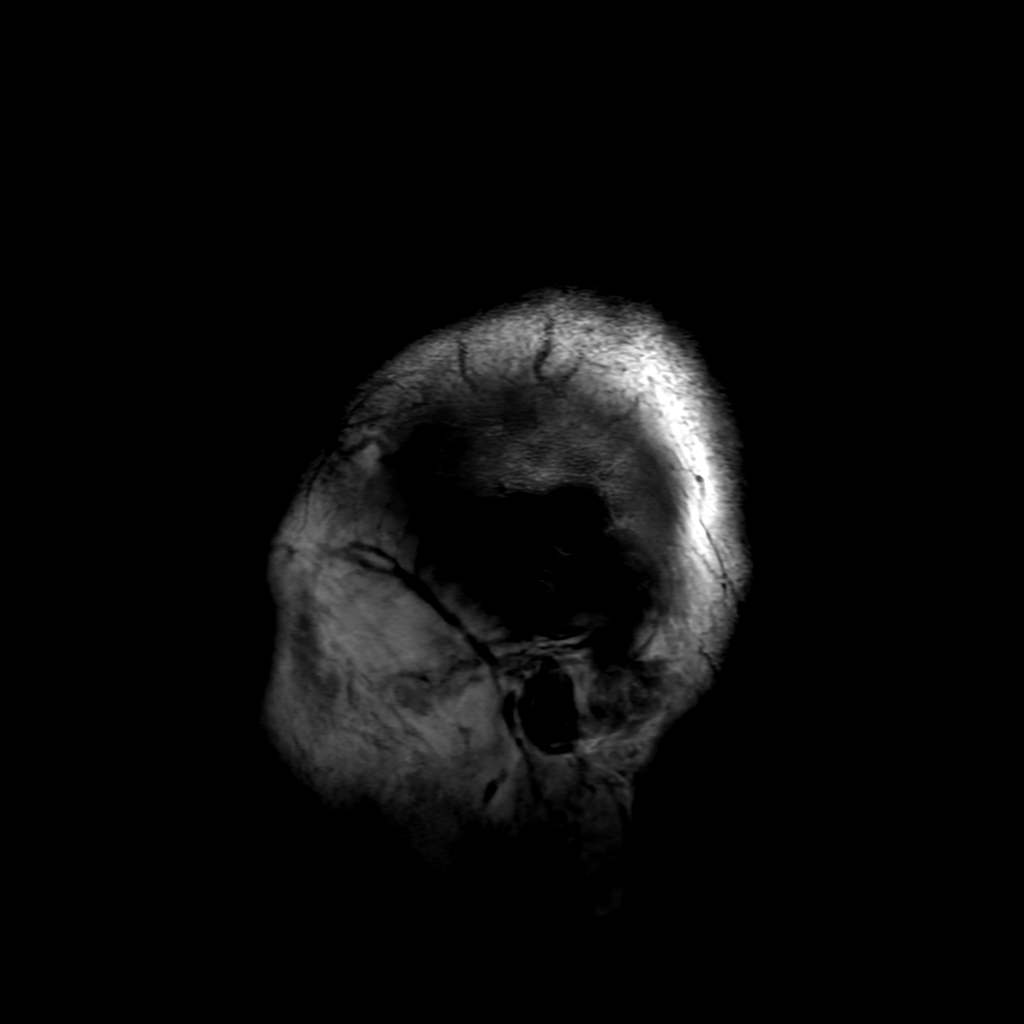
[im 25/25]
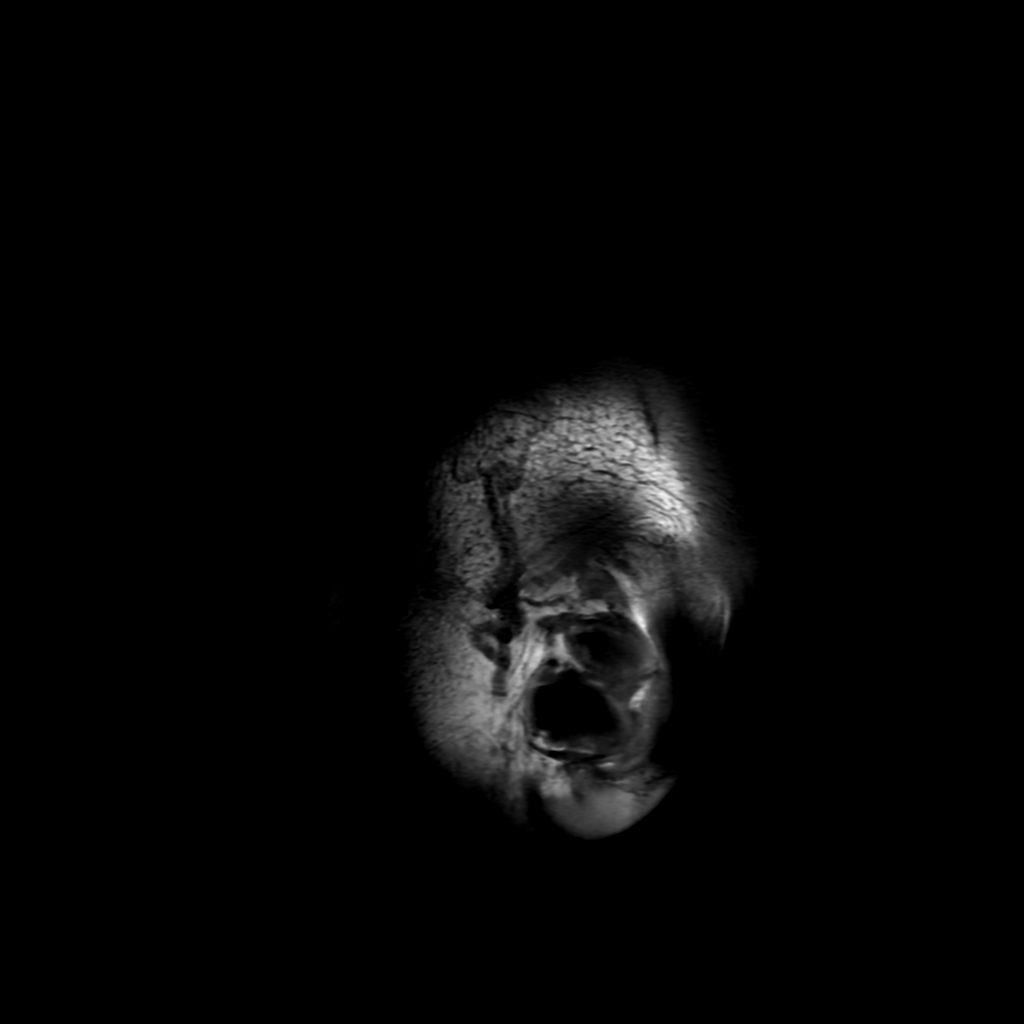

[Series 6: FLAIR · axial · 4.0mm · 0.45mm/px · z∈[-46,+103]mm · 3 of 35 slices shown (2 of 2)]
[im 1/35]
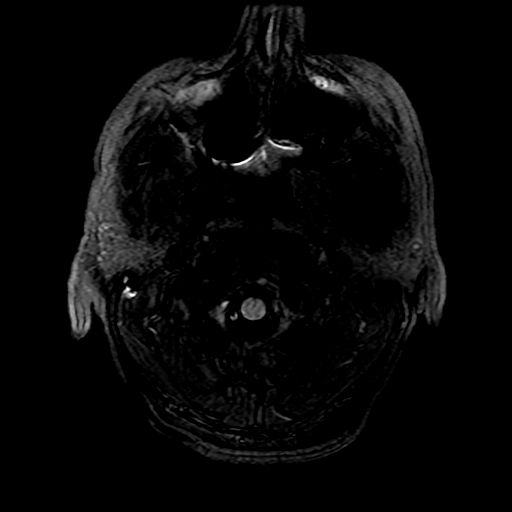
[im 18/35]
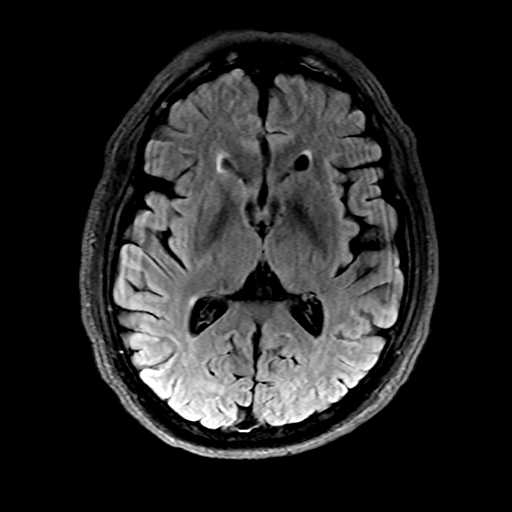
[im 35/35]
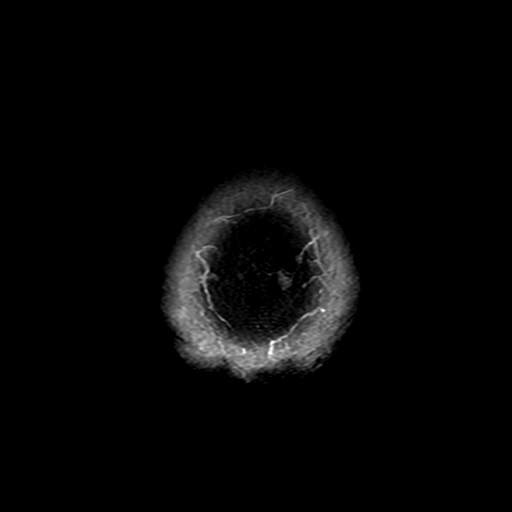

[Series 250: ADC · axial · 3.0mm · 0.94mm/px · z∈[-42,+105]mm · 5 of 50 slices shown (1 of 2)]
[im 1/50]
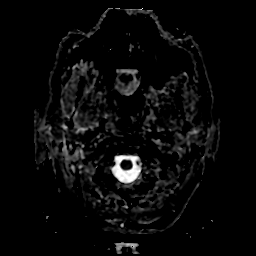
[im 13/50]
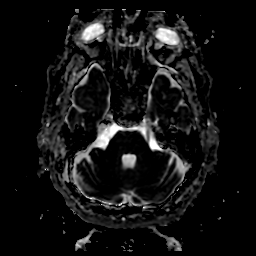
[im 25/50]
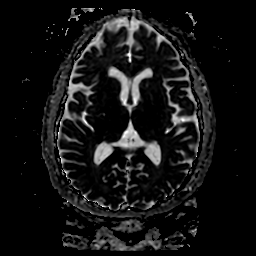
[im 37/50]
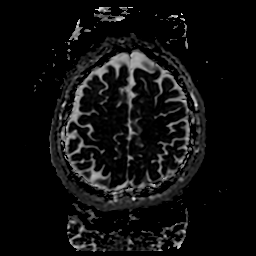
[im 50/50]
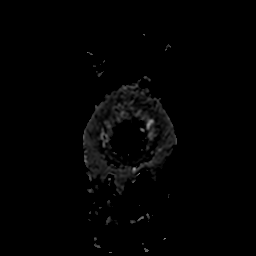

[Series 350: ADC · coronal · 4.0mm · 0.94mm/px · 3 of 37 slices shown (2 of 2)]
[im 1/37]
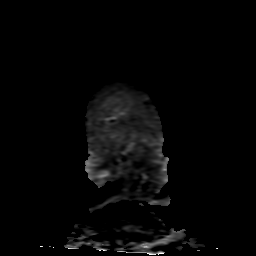
[im 19/37]
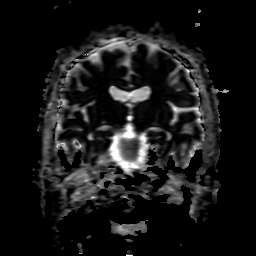
[im 37/37]
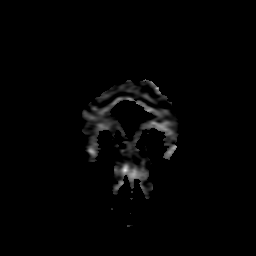

[29 of 48 positions shown; findings below may reference images not displayed]

FINDINGS: The study is intermittently motion degraded, including severe motion
on the coronal T2 sequence.

Brain: There is no evidence of an acute infarct, intracranial
hemorrhage, mass, midline shift, or extra-axial fluid collection.
Small T2 hyperintensities in the cerebral white matter bilaterally
are nonspecific but compatible with minimal chronic small vessel
ischemic disease. There is mild cerebral atrophy.

Vascular: Major intracranial vascular flow voids are preserved.

Skull and upper cervical spine: Unremarkable bone marrow signal.

Sinuses/Orbits: Unremarkable orbits. Mild bilateral ethmoid sinus
mucosal thickening. Small right and trace left mastoid effusions.

Other: None.
IMPRESSION: 1. No acute intracranial abnormality.
2. Minimal chronic small vessel ischemic disease and mild cerebral
atrophy.

## 2024-04-29 ENCOUNTER — Emergency Department (HOSPITAL_BASED_OUTPATIENT_CLINIC_OR_DEPARTMENT_OTHER)
Admission: EM | Admit: 2024-04-29 | Discharge: 2024-04-29 | Disposition: A | Discharge status: Home / Self Care | Attending: Emergency Medicine | Admitting: Emergency Medicine

## 2024-04-29 ENCOUNTER — Other Ambulatory Visit: Payer: Self-pay

## 2024-04-29 ENCOUNTER — Encounter (HOSPITAL_BASED_OUTPATIENT_CLINIC_OR_DEPARTMENT_OTHER): Payer: Self-pay

## 2024-04-29 ENCOUNTER — Emergency Department (HOSPITAL_BASED_OUTPATIENT_CLINIC_OR_DEPARTMENT_OTHER)

## 2024-04-29 DIAGNOSIS — I10 Essential (primary) hypertension: Secondary | ICD-10-CM | POA: Diagnosis not present

## 2024-04-29 DIAGNOSIS — R109 Unspecified abdominal pain: Secondary | ICD-10-CM | POA: Diagnosis present

## 2024-04-29 DIAGNOSIS — K5792 Diverticulitis of intestine, part unspecified, without perforation or abscess without bleeding: Secondary | ICD-10-CM

## 2024-04-29 DIAGNOSIS — K5790 Diverticulosis of intestine, part unspecified, without perforation or abscess without bleeding: Secondary | ICD-10-CM | POA: Insufficient documentation

## 2024-04-29 DIAGNOSIS — Z79899 Other long term (current) drug therapy: Secondary | ICD-10-CM | POA: Diagnosis not present

## 2024-04-29 LAB — COMPREHENSIVE METABOLIC PANEL WITH GFR
ALT: 27 U/L (ref 0–44)
AST: 19 U/L (ref 15–41)
Albumin: 4.5 g/dL (ref 3.5–5.0)
Alkaline Phosphatase: 88 U/L (ref 38–126)
Anion gap: 12 (ref 5–15)
BUN: 16 mg/dL (ref 8–23)
CO2: 28 mmol/L (ref 22–32)
Calcium: 9.6 mg/dL (ref 8.9–10.3)
Chloride: 101 mmol/L (ref 98–111)
Creatinine, Ser: 1.15 mg/dL (ref 0.61–1.24)
GFR, Estimated: >60 mL/min
Glucose, Bld: 113 mg/dL — ABNORMAL HIGH (ref 70–99)
Potassium: 4.2 mmol/L (ref 3.5–5.1)
Sodium: 140 mmol/L (ref 135–145)
Total Bilirubin: 0.5 mg/dL (ref 0.0–1.2)
Total Protein: 7.8 g/dL (ref 6.5–8.1)

## 2024-04-29 LAB — CBC
HCT: 43.4 % (ref 39.0–52.0)
Hemoglobin: 14.4 g/dL (ref 13.0–17.0)
MCH: 26.7 pg (ref 26.0–34.0)
MCHC: 33.2 g/dL (ref 30.0–36.0)
MCV: 80.4 fL (ref 80.0–100.0)
Platelets: 126 K/uL — ABNORMAL LOW (ref 150–400)
RBC: 5.4 MIL/uL (ref 4.22–5.81)
RDW: 12.9 % (ref 11.5–15.5)
WBC: 8.4 K/uL (ref 4.0–10.5)
nRBC: 0 % (ref 0.0–0.2)

## 2024-04-29 LAB — LIPASE, BLOOD: Lipase: 28 U/L (ref 11–51)

## 2024-04-29 MED ORDER — HYDROCODONE-ACETAMINOPHEN 5-325 MG PO TABS: 2.0000 | ORAL_TABLET | ORAL | 0 refills | Status: DC | PRN

## 2024-04-29 MED ORDER — HYDROCODONE-ACETAMINOPHEN 5-325 MG PO TABS: 2.0000 | ORAL_TABLET | Freq: Four times a day (QID) | ORAL | 0 refills | Status: AC | PRN

## 2024-04-29 MED ORDER — IOHEXOL 300 MG/ML  SOLN
100.0000 mL | Freq: Once | INTRAMUSCULAR | Status: AC | PRN
  Administered 2024-04-29: 100 mL via INTRAVENOUS

## 2024-04-29 MED ORDER — FENTANYL CITRATE (PF) 50 MCG/ML IJ SOSY
50.0000 ug | PREFILLED_SYRINGE | Freq: Once | INTRAMUSCULAR | Status: AC
  Administered 2024-04-29: 50 ug via INTRAVENOUS
  Filled 2024-04-29: qty 1

## 2024-04-29 MED ORDER — AMOXICILLIN-POT CLAVULANATE 875-125 MG PO TABS: 1.0000 | ORAL_TABLET | Freq: Two times a day (BID) | ORAL | 0 refills | Status: AC

## 2024-04-29 NOTE — ED Notes (Addendum)
 This RN reviewed discharge instructions w/ pt, sister to translate per pt request.  Fentanyl  given at 1617. Instructed that pt must wait 30 mins before discharge to be monitored after IV pain med administration. Pt and family verbalized understanding.

## 2024-04-29 NOTE — ED Triage Notes (Signed)
 Pt arrives with lower abdominal pain. Pain started on Friday. States that he went to [facility] and they sent him here.

## 2024-04-29 NOTE — ED Notes (Signed)
 Pt refused vital signs prior to discharge; was ready to go home.

## 2024-04-29 NOTE — ED Provider Notes (Signed)
 " [place] [facility] Provider Note   CSN: [id number] Arrival date & time: 04/29/24  1146     Patient presents with: Abdominal Pain   Brent Fisher is a 78 y.o. male who presents to the ED today with 5 days of constipation.  Been getting progressively worse and today also presents with nausea.  He is able to tolerate small amounts of food and liquids however states that with food consumption he does have an exacerbation of his nausea.  Has not vomited.  Patient denies any previous medical history however review of his previous medical records does show previous diagnosis of hypertension , Hyperlipidemia.  As of August of this year showing taking Crestor for hyperlipidemia and regarding the hypertension is currently on lisinopril.   Abdominal Pain Associated symptoms: constipation and nausea        Prior to Admission medications  Medication Sig Start Date End Date Taking? Authorizing Provider  amoxicillin -clavulanate (AUGMENTIN ) 875-125 MG tablet Take 1 tablet by mouth every 12 (twelve) hours. 04/29/24  Yes Myriam Dorn BROCKS, PA  HYDROcodone -acetaminophen  (NORCO/VICODIN) 5-325 MG tablet Take 2 tablets by mouth every 4 (four) hours as needed. 04/29/24  Yes Myriam Dorn BROCKS, PA  rosuvastatin (CRESTOR) 10 MG tablet Take 10 mg by mouth daily. 03/22/24  Yes [provider]  predniSONE  (DELTASONE ) 20 MG tablet Take 60 mg daily x 2 days then 40 mg daily x 2 days then 20 mg daily x 2 days 09/19/21   Patt Alm Macho, MD  valACYclovir  (VALTREX ) 1000 MG tablet Take 1 tablet (1,000 mg total) by mouth 3 (three) times daily. 09/19/21   Patt Alm Macho, MD    Allergies: Patient has no known allergies.    Review of Systems  Gastrointestinal:  Positive for abdominal pain, constipation and nausea.  All other systems reviewed and are negative.   Updated Vital Signs BP 139/87 (BP Location: Right Arm)   Pulse 69   Temp 98 F (36.7 C) (Oral)    Resp 16   Ht 5' 6 (1.676 m)   Wt 83.9 kg   SpO2 97%   BMI 29.86 kg/m   Physical Exam Vitals and nursing note reviewed.  Constitutional:      General: He is not in acute distress.    Appearance: Normal appearance. He is well-developed and normal weight.  HENT:     Head: Normocephalic and atraumatic.     Mouth/Throat:     Mouth: Mucous membranes are moist.     Pharynx: Oropharynx is clear.  Eyes:     General: No scleral icterus.    Extraocular Movements: Extraocular movements intact.     Conjunctiva/sclera: Conjunctivae normal.     Pupils: Pupils are equal, round, and reactive to light.  Cardiovascular:     Rate and Rhythm: Normal rate and regular rhythm.     Pulses: Normal pulses.     Heart sounds: Normal heart sounds. No murmur heard.    No friction rub. No gallop.  Pulmonary:     Effort: Pulmonary effort is normal.     Breath sounds: Normal breath sounds.  Abdominal:     General: Abdomen is flat. Bowel sounds are normal.     Palpations: Abdomen is soft.     Tenderness: There is generalized abdominal tenderness. There is no right CVA tenderness or left CVA tenderness. Negative signs include Murphy's sign, Rovsing's sign, McBurney's sign and psoas sign.  Musculoskeletal:        General: Normal  range of motion.     Cervical back: Normal range of motion and neck supple.     Right lower leg: No edema.     Left lower leg: No edema.  Skin:    General: Skin is warm and dry.     Capillary Refill: Capillary refill takes less than 2 seconds.  Neurological:     General: No focal deficit present.     Mental Status: He is alert and oriented to person, place, and time. Mental status is at baseline.  Psychiatric:        Mood and Affect: Mood normal.        Behavior: Behavior normal.     (all labs ordered are listed, but only abnormal results are displayed) Labs Reviewed  COMPREHENSIVE METABOLIC PANEL WITH GFR - Abnormal; Notable for the following components:      Result  Value   Glucose, Bld 113 (*)    All other components within normal limits  CBC - Abnormal; Notable for the following components:   Platelets 126 (*)    All other components within normal limits  LIPASE, BLOOD  URINALYSIS, ROUTINE W REFLEX MICROSCOPIC    EKG: None  Radiology: CT ABDOMEN PELVIS W CONTRAST Result Date: 04/29/2024 CLINICAL DATA:  Abdominal pain nausea vomiting EXAM: CT ABDOMEN AND PELVIS WITH CONTRAST TECHNIQUE: Multidetector CT imaging of the abdomen and pelvis was performed using the standard protocol following bolus administration of intravenous contrast. RADIATION DOSE REDUCTION: This exam was performed according to the departmental dose-optimization program which includes automated exposure control, adjustment of the mA and/or kV according to patient size and/or use of iterative reconstruction technique. CONTRAST:  OMNIPAQUE  IOHEXOL  300 MG/ML  SOLN COMPARISON:  CT 05/05/2014, CT report 10/14/2017 FINDINGS: Lower chest: Small calcified granulomas at the lung bases. Dependent atelectasis. The heart appears slightly enlarged Hepatobiliary: No focal liver abnormality is seen. No gallstones, gallbladder wall thickening, or biliary dilatation. Pancreas: Unremarkable. No pancreatic ductal dilatation or surrounding inflammatory changes. Spleen: Normal in size without focal abnormality. Adrenals/Urinary Tract: Adrenal glands are normal. Kidneys show no hydronephrosis. Right renal cysts and bilateral subcentimeter hypodensities too small to further characterize, no specific imaging follow-up is recommended. Small stones lower pole left kidney measuring up to 6 mm. Punctate stones within the bladder, for example series 2, image 81. Stomach/Bowel: Stomach within normal limits. No dilated small bowel. Mild wall thickening and mucosal enhancement involving the ascending colon and hepatic flexure with diverticula present. No perforation or abscess. Appendix is not clearly identified.  Vascular/Lymphatic: Mild atherosclerosis. No aneurysmal dilatation of the aorta. Tortuous, mildly aneurysmal bilateral common iliac arteries, on the right measuring up to 18 mm and on the left measuring up to 21 mm. No suspicious lymph nodes Reproductive: Prostate is enlarged Other: No ascites or free air Musculoskeletal: No acute or suspicious osseous abnormality IMPRESSION: 1. Mild wall thickening and mucosal enhancement involving the ascending colon and hepatic flexure with diverticula present, findings could be secondary to mild diverticulitis or colitis of infectious/inflammatory etiology. No perforation or abscess. 2. Nonobstructing left kidney stones. Punctate stones within the bladder. 3. Enlarged prostate. 4. Tortuous, aneurysmal bilateral common iliac arteries. 5. Aortic atherosclerosis. Aortic Atherosclerosis (ICD10-I70.0). Electronically Signed   By: Luke Bun M.D.   On: 04/29/2024 15:48     Procedures   Medications Ordered in the ED  fentaNYL  (SUBLIMAZE ) injection 50 mcg (50 mcg Intravenous Given 04/29/24 1418)  iohexol  (OMNIPAQUE ) 300 MG/ML solution 100 mL (100 mLs Intravenous Contrast Given 04/29/24 1437)  fentaNYL  (SUBLIMAZE ) injection 50 mcg (50 mcg Intravenous Given 04/29/24 1617)                                    Medical Decision Making Amount and/or Complexity of Data Reviewed Labs: ordered. Radiology: ordered.  Risk Prescription drug management.   Medical Decision Making:   Brent Fisher is a 78 y.o. male who presented to the ED today with constipation and abdominal pain detailed above.    Additional history discussed with patient's family/caregivers.  External chart has been reviewed including previous labs, imaging, primary care records. Patient placed on continuous vitals and telemetry monitoring while in ED which was reviewed periodically.  Complete initial physical exam performed, notably the patient  was alert and oriented in no apparent distress.  There  is a generally tender abdomen to palpation however normal and present bowel sounds are appreciated..    Reviewed and confirmed nursing documentation for past medical history, family history, social history.    Initial Assessment:   With the patient's presentation of constipation and abdominal pain, due to possible bowel obstruction.  Also consider possible mesenteric ischemia, diverticulitis/diverticulosis.  Also consider possible inflammatory bowel disorder.  Consider possible malrotation of the colon such as volvulus or intussusception.  Given diffuse abdominal pain consider possible hepatobiliary origin as well as pancreatitis.  Initial Plan:  CT imaging of the abdomen pelvis with contrast to address intra-abdominal pathology. Screening labs including CBC and Metabolic panel to evaluate for infectious or metabolic etiology of disease.  Include serum lipase to address possible pancreatic etiology Provide IV fentanyl  for pain management. Objective evaluation as below reviewed   Initial Study Results:   Laboratory  All laboratory results reviewed without evidence of clinically relevant pathology.   Exceptions include: None  Radiology:  All images reviewed independently. Agree with radiology report at this time.   CT ABDOMEN PELVIS W CONTRAST Result Date: 04/29/2024 CLINICAL DATA:  Abdominal pain nausea vomiting EXAM: CT ABDOMEN AND PELVIS WITH CONTRAST TECHNIQUE: Multidetector CT imaging of the abdomen and pelvis was performed using the standard protocol following bolus administration of intravenous contrast. RADIATION DOSE REDUCTION: This exam was performed according to the departmental dose-optimization program which includes automated exposure control, adjustment of the mA and/or kV according to patient size and/or use of iterative reconstruction technique. CONTRAST:  OMNIPAQUE  IOHEXOL  300 MG/ML  SOLN COMPARISON:  CT 05/05/2014, CT report 10/14/2017 FINDINGS: Lower chest: Small  calcified granulomas at the lung bases. Dependent atelectasis. The heart appears slightly enlarged Hepatobiliary: No focal liver abnormality is seen. No gallstones, gallbladder wall thickening, or biliary dilatation. Pancreas: Unremarkable. No pancreatic ductal dilatation or surrounding inflammatory changes. Spleen: Normal in size without focal abnormality. Adrenals/Urinary Tract: Adrenal glands are normal. Kidneys show no hydronephrosis. Right renal cysts and bilateral subcentimeter hypodensities too small to further characterize, no specific imaging follow-up is recommended. Small stones lower pole left kidney measuring up to 6 mm. Punctate stones within the bladder, for example series 2, image 81. Stomach/Bowel: Stomach within normal limits. No dilated small bowel. Mild wall thickening and mucosal enhancement involving the ascending colon and hepatic flexure with diverticula present. No perforation or abscess. Appendix is not clearly identified. Vascular/Lymphatic: Mild atherosclerosis. No aneurysmal dilatation of the aorta. Tortuous, mildly aneurysmal bilateral common iliac arteries, on the right measuring up to 18 mm and on the left measuring up to 21 mm. No suspicious lymph nodes Reproductive: Prostate is enlarged  Other: No ascites or free air Musculoskeletal: No acute or suspicious osseous abnormality IMPRESSION: 1. Mild wall thickening and mucosal enhancement involving the ascending colon and hepatic flexure with diverticula present, findings could be secondary to mild diverticulitis or colitis of infectious/inflammatory etiology. No perforation or abscess. 2. Nonobstructing left kidney stones. Punctate stones within the bladder. 3. Enlarged prostate. 4. Tortuous, aneurysmal bilateral common iliac arteries. 5. Aortic atherosclerosis. Aortic Atherosclerosis (ICD10-I70.0). Electronically Signed   By: Luke Bun M.D.   On: 04/29/2024 15:48    Reassessment and Plan:   CT imaging does show some mucosal  edema in the ascending colon as well as the hepatic flexure with presence of diverticula suggesting mild diverticulitis.  There is no leukocytosis on the lab evaluation and remainder of lab evaluation is unremarkable.  Given this finding is consistent with diverticulitis and will begin treatment with Augmentin  for same as well as prescribe a course of hydrocodone  for pain management during his recovery.  Referred to primary care for follow-up.  This was discussed thoroughly with the patient and his family was they verbalized understanding and agreement.  They have no further concerns at this time, do understand potential red flags and careful return precautions.       Final diagnoses:  Diverticulitis    ED Discharge Orders          Ordered    HYDROcodone -acetaminophen  (NORCO/VICODIN) 5-325 MG tablet  Every 4 hours PRN        04/29/24 1604    amoxicillin -clavulanate (AUGMENTIN ) 875-125 MG tablet  Every 12 hours        04/29/24 1604               Myriam Dorn BROCKS, [place] 04/29/24 1625    Randol Simmonds, MD 05/02/24 [phone]  "
